# Patient Record
Sex: Male | Born: 2000 | Hispanic: No | Marital: Single | Smoking: Never smoker
Health system: Southern US, Community
[De-identification: ages and names within clinical notes are randomized; demographics above are authoritative.]

## PROBLEM LIST (undated history)

## (undated) DIAGNOSIS — K3580 Unspecified acute appendicitis: Secondary | ICD-10-CM

## (undated) DIAGNOSIS — R569 Unspecified convulsions: Secondary | ICD-10-CM

## (undated) DIAGNOSIS — E230 Hypopituitarism: Secondary | ICD-10-CM

## (undated) HISTORY — DX: Unspecified convulsions: R56.9

## (undated) HISTORY — DX: Unspecified acute appendicitis: K35.80

## (undated) HISTORY — PX: APPENDECTOMY: SHX54

---
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
## 2024-08-09 NOTE — Progress Notes (Signed)
 [facility] NE[facility] CLINIC [address] [place] [address] [place] [address] [phone]  Date: 08/09/2024  Patient Name: Brett Ward  MRN: [id number]  PCP: Alvira Ragena Ip Referring Provider: Pcp, None Per Patient  Assessment & Plan:  Brett Ward is a 23 y.o. 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 He had his first seizure in July 2016.  Following which he was started on Keppra  and remained seizure-free.  In 2019 Keppra  was withdrawn and 3 weeks later he had which after a failed trial of zonisamide he was put back on Keppra  and has been seizure-free since then.  His EEG has shown left frontotemporal spikes and one of his ambulatory EEG in 2020 had shown a right hemispheric seizure.  His MRI brain has been normal We had discussion about withdrawal of antiepileptic and the risk of breakthrough seizures associated with it.  He understood that the risk of seizure is higher than him but would still like to try preferably next summer. Currently he is not experiencing any side effects with Keppra  we will continue the same dose and Keppra  levels. Will consider an EEG just prior to trial of withdrawal Keppra    Epilepsy Classification: Focal, left hemisphere ILAE Seizure Classification: Focal with impaired consciousness Semiology staring followed by right version Etiology: Genetic, SETD2 mutation     Plan Wants to try tapering Keppa again but in next summers-experiencing no side effects Discussed about the epilepsy association with the SETD2 mutation Will continue Keppra  750 mg twice daily Keppra  levels - can get it drawn at his primary care office [Upcoming visit in October] Discussed about seizure precautions     I spent 40 minutes with the patient on the date of service. I spent an  additional 10 minutes on pre- and post-visit activities on the date of service.    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rology Department [facility]  He is accompanied by his mother in this visit who contributes to the history along with Brett Ward.   The first episode occurred in 2016.  The patient was lying in the bed watching TV and then his mother asked to sit up  to receive his growth hormone shot which he had been taking for a while.  She noticed that he was staring with right gaze turning and right body turning with stiffness affecting the whole body.  He turned gray/blue in that event and mother gave some chest compressions and mouth-to-mouth breathing.  His jaws were clenched.  Her sister called 911 it took approximately 20 to 25 minutes for him to come back to himself.  He did not have any recollection of the event and he denied any clonic jerking.  It was not associated with any tongue bite or incontinence.  He was started on Keppra  which he tolerated  well and was effective. In 2019 his Keppra  was tapered off and 3 weeks later on 04/28/2018 he had a breakthrough seizure which was described as staring episode with turning on the right side lasted for less than 2 minutes patient was able to respond more quickly .  He was given a trial of zonisamide which she did not tolerate and was started back on Keppra  which she continues to take.  Discussions have been about withdrawing the medication again  but he deferred until he was in the college.   Seizure types  Type I Focal - left hemisphere Semiology: Staring followed by right head, eye, torso version  Frequency 2016, 2019 after Keppra  withdrawal    Type II Subclinical seizure arising from right hemisphere was noted in the EEG of 12/23/2018 Interval History:  Single event 22 minute EEG seizure, R hemisphere Semiology family and patient not aware of EEG seizure on ambulatory EEG  - 12/23/2018 at 10:25 the EEG shows the development of rhythmical theta range activity in the right hemisphere with subsequent evolution consistent with a focal onset seizure.  This activity continues until at least 10:47 and is followed by an extended period of focal slowing in the right hemisphere.     Seizure Risk Factors/Pertinent history: Preterm, Prenatal Injury, Congenital Malformation: No -3 weeks prior to Eed ,neonatal jaundice  Febrile Seizures:   No Had learning difficulties -no reports of zoning out  Family History of Epilepsy:   Yes -recently her daughter had a seizure at age of 64 yr  like activity -on medication - in [employer] -deployed in [place] after [place]  Stroke:               No Hypoxic brain injury       No Traumatic brain injury:  No Brain Tumor   No Malignancy                             No CNS Infection:               No Prior Neurosurgery                 No Psychiatric disorder                No Alcohol abuse                         No Illicit drug use                          No    CURRENT ASMs: Keppra   PRIOR ASMs: Zonisamide     Driving status:  Drives  Employment:  Applying for some horticulture jobs Social Support : lives with parent   REVIEW OF SYSTEM The review of system was negative except that noted in HPI    PRIOR EVALUATION:   INVESTIGATIONS SUMMARY from Dr Automatic Data note of 03/27/2019[[place]   Laboratory -     07/07/2018 - epilepsy gene panel (181 genes) - One Pathogenic variant identified  in SETD2. 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   04/07/17 - Carbohydrate deficient transferrin    2008-2009  karotype and microarray - normal   2008-2007 Urine oa's: Non-diagnostic urine organic acid pattern with small amounts of ethylmalonic, 2-OH-glutaric, glutaric, and dicarboxylic acids. Urine aa's normal CK 54 46 XY, diGeorge FISH neg.   07/28/15  Normal CBC,  CMP, Vitamin D   Radiology -    06/30/2015 - head CT reported normal, (at [facility]) 2006 - MRI brain @ [facility] (done in [place] reviewed by Dr. Alline): normal 09/12/15  Normal MRI brain    Neurophysiology -  12/01/15 - ambulatory EEG over 72 hours - normal.   09/12/15 Routine EEG: SUMMARY: This is an abnormal EEG because of:  - Spike wave/sharp wave discharges seen over the left frontal-temporal head regions. - At times, slow activity skewed or seen over the left was identified.     12/25/2018 This EEG study is abnormal secondary to the occurrence of an apparent electrographic seizure during the study.    EXTERNAL RECORDS: Reviewed available records   CURRENT MEDICATIONS: Current Medications[1]  ALLERGIES: Allergies[2]  Past Medical Hx: Past Medical History[3]  Past Surgical Hx: Past Surgical History[4]   Social History[5]  Family Hx: Family History[6]      Objective:    Physical Exam:  Blood pressure 110/71, pulse 91, height 168 cm (5' 6.14), weight 62.6 kg (138 lb).  Physical Exam          Neurological Examination:  MS: Alert and with appropriate affect, speech and language are normal, patient is able to provide details of their own medical history; follows 2 step commands; oriented to self, place, time; registration and recall intact at 5 minutes  Cranial nerves: EOMI without nystagmus, visual fields are full, face is symmetric, tongue midline, shoulder shrug symmetric, hearing is intact  Motor: Normal tone and strength bilaterally, no tremor or involuntary  movements  Sensory: Intact to light touch throughout  Reflexes: Symmetric  Coordination: No ataxia on finger to nose testing bilaterally  Gait:  Casual gait is intact, able to tandem without difficulty, Romberg negative    LABS:   Results          Levetiracetam  Lvl  Date Value Ref Range Status  08/11/2017 28.3 12.0 - 46.0 mcg/mL Final    Comment:      -------------------ADDITIONAL INFORMATION------------------- This test was developed and its performance characteristics  determined by [facility] in a manner consistent with CLIA  requirements. This test has not been cleared or approved by  the U.S. Food and Drug Administration.   Test Performed by: [facility] [address] [place], [place], [place] [address]  04/07/2017 8.8 (L) 12.0 - 46.0 mcg/mL Final    Comment:      -------------------ADDITIONAL INFORMATION------------------- This test was developed and its performance characteristics  determined by [facility] in a manner consistent with CLIA  requirements. This test has not been cleared or approved by  the U.S. Food and Drug Administration.   Test Performed by: [facility] [address] [place], [place], [place] [address]     Lab Results  Component Value Date   LEVETIRACETA 28.3 08/11/2017   LEVETIRACETA 8.8 (L) 04/07/2017         Maryfrances Nora, MD Assistant Professor Division of Epilepsy Department of Neurology       [1] Current Outpatient Medications  Medication Sig Dispense Refill  . cholecalciferol, vitamin D3, 25 mcg (1,000 unit) Chew Chew.    . levETIRAcetam  (KEPPRA ) 750 MG tablet Take 1.5 tablets (1,125 mg total) by mouth two (2) times a day. 270 tablet 0  . ondansetron  (ZOFRAN -ODT) 4 MG disintegrating tablet Dissolve 1 tablet (4 mg total) in the mouth every eight (8) hours as needed for nausea. 20 tablet 0  . rizatriptan (MAXALT-MLT)  5 MG disintegrating tablet 1 tablet  PO at start of migraine headache. May repeat in 2 hours if needed. Max 2 per day and 4 per week. Please split between 2 bottles so pt can take medication to school 10 tablet 1   No current facility-administered medications for this visit.  [2] No Known Allergies [3] Past Medical History: Diagnosis Date  . ADHD (attention deficit hyperactivity disorder)   . Financial difficulties   . Growth hormone deficiency (HHS-HCC)   . Hypotonia    received PT and OT  . Mononucleosis   . Pneumonia  1 y  . Seizure       [4] Past Surgical History: Procedure Laterality Date  . APPENDECTOMY    . surgical tooth extractions    [5] Social History Socioeconomic History  . Marital status: Single  Tobacco Use  . Smoking status: Never    Passive exposure: Yes  . Smokeless tobacco: Never  Substance and Sexual Activity  . Sexual activity: Never  Social History Narrative   In the 4th grade doing well. Lives with parents and older sister.   [6] Family History Problem Relation Age of Onset  . Delayed puberty Father   . ADD / ADHD Father   . Migraines Father   . ADD / ADHD Sister   . Glaucoma Maternal Grandmother   . Cataracts Maternal Grandmother   . Glaucoma Paternal Grandfather   . Cataracts Paternal Grandfather   . Amblyopia Neg Hx

## 2024-09-13 ENCOUNTER — Ambulatory Visit: Payer: Self-pay

## 2024-09-13 VITALS — BP 120/80 | HR 78 | Temp 99.2°F | Ht 66.5 in | Wt 137.0 lb

## 2024-09-13 DIAGNOSIS — E559 Vitamin D deficiency, unspecified: Secondary | ICD-10-CM | POA: Diagnosis not present

## 2024-09-13 DIAGNOSIS — Z23 Encounter for immunization: Secondary | ICD-10-CM | POA: Diagnosis not present

## 2024-09-13 DIAGNOSIS — R569 Unspecified convulsions: Secondary | ICD-10-CM

## 2024-09-13 DIAGNOSIS — G43909 Migraine, unspecified, not intractable, without status migrainosus: Secondary | ICD-10-CM | POA: Insufficient documentation

## 2024-09-13 DIAGNOSIS — G43809 Other migraine, not intractable, without status migrainosus: Secondary | ICD-10-CM | POA: Diagnosis not present

## 2024-09-13 DIAGNOSIS — Z113 Encounter for screening for infections with a predominantly sexual mode of transmission: Secondary | ICD-10-CM

## 2024-09-13 NOTE — Progress Notes (Signed)
   Subjective:    Patient ID: Brett Ward, male    DOB: 20-Sep-2001, 23 y.o.   MRN: [id number]   Brett Ward is a very pleasant 23 y.o. male who presents today as a new patient.  Past medical, surgical and family history: Reviewed and updated in chart.  Allergies: Reviewed and updated in chart.  Medications: Reviewed and updated in chart.  Social history: Reviewed and updated in chart.  Last PCP and reason for leaving: Dr Melonie, left  Says he had a migraine earlier today, took migraine medication, but denies needing his rizatriptan more than twice a week. Denies any recent seizures, compliant with his Keppra . Says he was told several years ago that he had vitamin D deficiency and that is why he takes over-the-counter vitamin D.   Review of Systems  All other systems reviewed and are negative.       BP 120/80 (BP Location: Left Arm, Patient Position: Sitting, Cuff Size: Normal)   Pulse 78   Temp 99.2 F (37.3 C) (Oral)   Ht 5' 6.5 (1.689 m)   Wt 137 lb (62.1 kg)   SpO2 98%   BMI 21.78 kg/m   Objective:    Physical Exam Vitals and nursing note reviewed.  Constitutional:      Appearance: Normal appearance.  HENT:     Head: Normocephalic and atraumatic.  Eyes:     Extraocular Movements: Extraocular movements intact.     Conjunctiva/sclera: Conjunctivae normal.  Skin:    General: Skin is warm.  Neurological:     Mental Status: He is alert.  Psychiatric:        Mood and Affect: Mood normal.        Behavior: Behavior normal.         Assessment & Plan:   1. Other migraine without status migrainosus, not intractable (Primary) New patient, past medical and social history thoroughly reviewed and updated in chart.  Symptoms well-controlled on regimen below, no acute changes at this time.  - Continue rizatriptan 5 mg as needed  2. Vitamin D deficiency Reportedly was told he has low vitamin D and has been taking a supplement, no recent labs in chart,  will order below for monitoring.  - VITAMIN D 25 Hydroxy (Vit-D Deficiency, Fractures); Future  3. Seizures (HCC) Patient has a history of seizures with which he follows with neurology from [facility], is compliant with his Keppra , denies any breakthrough seizures, will order level below for monitoring. Per chart review, patient will consider coming off Keppra  in the near future.  - Levetiracetam  level; Future - Continue Keppra  750 mg daily  4. Screening for STD (sexually transmitted disease) 5. Need for influenza vaccination Per chart review, HIV was done but not hep C, ordered below in preparation for upcoming physical, flu shot given this visit.  - Hepatitis C antibody; Future - Flu vaccine trivalent PF, 6mos and older(Flulaval,Afluria,Fluarix,Fluzone)    Return in about 4 weeks (around 10/11/2024) for CPE, Pls schedule for lab visit 1 week prior.   Louan Base K Colie Josten, MD  09/13/24

## 2024-09-13 NOTE — Patient Instructions (Signed)
 Thank you for visiting [facility] today! Here's what we talked about: - Come back in 4wks for physical then lab appointment 1wk prior to that

## 2024-10-05 ENCOUNTER — Ambulatory Visit
Admission: EM | Admit: 2024-10-05 | Discharge: 2024-10-05 | Disposition: A | Discharge status: Home / Self Care | Attending: Emergency Medicine | Admitting: Emergency Medicine

## 2024-10-05 ENCOUNTER — Encounter: Payer: Self-pay | Admitting: Emergency Medicine

## 2024-10-05 ENCOUNTER — Ambulatory Visit: Payer: Self-pay

## 2024-10-05 ENCOUNTER — Telehealth: Payer: Self-pay | Admitting: Emergency Medicine

## 2024-10-05 DIAGNOSIS — B349 Viral infection, unspecified: Secondary | ICD-10-CM | POA: Diagnosis not present

## 2024-10-05 LAB — POC SOFIA SARS ANTIGEN FIA: SARS Coronavirus 2 Ag: POSITIVE — AB

## 2024-10-05 MED ORDER — BENZONATATE 100 MG PO CAPS: 100.0000 mg | ORAL_CAPSULE | Freq: Three times a day (TID) | ORAL | 0 refills | Status: DC

## 2024-10-05 MED ORDER — PROMETHAZINE-DM 6.25-15 MG/5ML PO SYRP: 5.0000 mL | ORAL_SOLUTION | Freq: Four times a day (QID) | ORAL | 0 refills | Status: DC | PRN

## 2024-10-05 MED ORDER — MOLNUPIRAVIR EUA 200MG CAPSULE
4.0000 | ORAL_CAPSULE | Freq: Two times a day (BID) | ORAL | 0 refills | Status: AC
Start: 2024-10-05 — End: 2024-10-10

## 2024-10-05 NOTE — Discharge Instructions (Signed)
 Covid 19 is a virus and should steadily improve in time it can take up to 7 to 10 days before you truly start to see a turnaround however things will get better    Per the CDC you will need to quarantine and to your 24 hours without fever, if no fever may continue activity wearing mask  Begin use of the antiviral taking every morning and every evening for 5 days to reduce the amount of virus within the body, if this is medicine is not covered by your insurance and is highly price you do not have to purchase it as you will get better naturally  You may use Tessalon pill every 8 hours for cough  You may use cough syrup every 6 hours for cough or nausea and vomiting  You can take Tylenol  and/or Ibuprofen as needed for fever reduction and pain relief.   For cough: honey 1/2 to 1 teaspoon (you can dilute the honey in water or another fluid).  You can also use guaifenesin and dextromethorphan for cough. You can use a humidifier for chest congestion and cough.  If you don't have a humidifier, you can sit in the bathroom with the hot shower running.      For sore throat: try warm salt water gargles, cepacol lozenges, throat spray, warm tea or water with lemon/honey, popsicles or ice, or OTC cold relief medicine for throat discomfort.   For congestion: take a daily anti-histamine like Zyrtec, Claritin, and a oral decongestant, such as pseudoephedrine.  You can also use Flonase 1-2 sprays in each nostril daily.   It is important to stay hydrated: drink plenty of fluids (water, gatorade/powerade/pedialyte, juices, or teas) to keep your throat moisturized and help further relieve irritation/discomfort.

## 2024-10-05 NOTE — ED Provider Notes (Signed)
 Brett Ward    CSN: [id number] Arrival date & time: 10/05/24  1427      History   Chief Complaint Chief Complaint  Patient presents with   Cough   Fever   Sore Throat   Emesis   Chills   Generalized Body Aches    HPI Brett Ward is a 23 y.o. male.    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   Attention deficit hyperactivity disorder (ADHD) 03/13/2012   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 Authorizing Provider  benzonatate (TESSALON) 100 MG capsule Take 1 capsule (100 mg total) by mouth every 8 (eight) hours. 10/05/24  Yes Sadik Piascik R, NP  molnupiravir EUA (LAGEVRIO) 200 mg CAPS capsule Take 4 capsules (800 mg total) by mouth 2 (two) times daily for 5 days. 10/05/24 10/10/24 Yes Suleima Ohlendorf, Shelba SAUNDERS, NP  promethazine -dextromethorphan (PROMETHAZINE -DM) 6.25-15 MG/5ML  syrup Take 5 mLs by mouth 4 (four) times daily as needed. 10/05/24  Yes Jazara Swiney R, NP  Cholecalciferol (VITAMIN D-3 PO) Take by mouth.    [provider]  Cyanocobalamin (B-12 PO) Take by mouth.    [provider]  levETIRAcetam  (KEPPRA ) 750 MG tablet Take by mouth. 12/23/17   [provider]  ondansetron  (ZOFRAN -ODT) 4 MG disintegrating tablet Take 4 mg by mouth every 8 (eight) hours as needed. 07/17/24   [provider]  rizatriptan (MAXALT-MLT) 5 MG disintegrating tablet Take 5 mg by mouth.  1 tablet PO at start of migraine headache. May repeat in 2 hours if needed. Max 2 per day and 4 per week. Please split between 2 bottles so pt can take medication to school 07/17/24   [provider]    Family History Family History  Problem Relation Age of Onset   Healthy Mother    Healthy Father    Hypertension Maternal Grandmother    Hypertension Maternal Grandfather     Social History Social History   Tobacco Use   Smoking status: Never    Passive exposure: Current   Smokeless tobacco: Never  Vaping Use   Vaping status: Never Used  Substance Use Topics   Alcohol use: Never   Drug use: Never     Allergies   Patient has no known allergies.   Review of Systems Review of Systems  Constitutional:  Positive for fever.  Respiratory:  Positive for cough.   Gastrointestinal:  Positive  for vomiting.     Physical Exam Triage Vital Signs ED Triage Vitals  Encounter Vitals Group     BP 10/05/24 1619 123/84     Girls Systolic BP Percentile --      Girls Diastolic BP Percentile --      Boys Systolic BP Percentile --      Boys Diastolic BP Percentile --      Pulse Rate 10/05/24 1619 (!) 101     Resp 10/05/24 1619 20     Temp 10/05/24 1619 98.2 F (36.8 C)     Temp Source 10/05/24 1619 Oral     SpO2 10/05/24 1619 96 %     Weight --      Height --      Head Circumference --      Peak Flow --      Pain Score 10/05/24 1616 3      Pain Loc --      Pain Education --      Exclude from Growth Chart --    No data found.  Updated Vital Signs BP 123/84 (BP Location: Right Arm)   Pulse (!) 101   Temp 98.2 F (36.8 C) (Oral)   Resp 20   SpO2 96%   Visual Acuity Right Eye Distance:   Left Eye Distance:   Bilateral Distance:    Right Eye Near:   Left Eye Near:    Bilateral Near:     Physical Exam Constitutional:      Appearance: Normal appearance.  HENT:     Head: Normocephalic.     Right Ear: Tympanic membrane, ear canal and external ear normal.     Left Ear: Tympanic membrane, ear canal and external ear normal.     Nose: Congestion present.     Mouth/Throat:     Pharynx: No oropharyngeal exudate or posterior oropharyngeal erythema.  Eyes:     Extraocular Movements: Extraocular movements intact.  Cardiovascular:     Rate and Rhythm: Normal rate and regular rhythm.     Pulses: Normal pulses.     Heart sounds: Normal heart sounds.  Pulmonary:     Effort: Pulmonary effort is normal.     Breath sounds: Normal breath sounds.  Neurological:     Mental Status: He is alert and oriented to person, place, and time. Mental status is at baseline.      UC Treatments / Results  Labs (all labs ordered are listed, but only abnormal results are displayed) Labs Reviewed  POC SOFIA SARS ANTIGEN FIA - Abnormal; Notable for the following components:      Result Value   SARS Coronavirus 2 Ag Positive (*)    All other components within normal limits    EKG   Radiology No results found.  Procedures Procedures (including critical care time)  Medications Ordered in UC Medications - No data to display  Initial Impression / Assessment and Plan / UC Course  I have reviewed the triage vital signs and the nursing notes.  Pertinent labs & imaging results that were available during my care of the patient were reviewed by me and considered in my medical decision making (see chart for details).  COVID-19, viral  illness  Patient is in no signs of distress nor toxic appearing.  Vital signs are stable.  Low suspicion for pneumonia, pneumothorax or bronchitis and therefore will defer imaging.  Prescribed antiviral, Tessalon and Promethazine  DM.  Discussed quarantine per the CDC.May use additional over-the-counter medications as needed  for supportive care.  May follow-up with urgent care as needed if symptoms persist or worsen.  Note given.   Final Clinical Impressions(s) / UC Diagnoses   Final diagnoses:  Viral illness     Discharge Instructions      Covid 19 is a virus and should steadily improve in time it can take up to 7 to 10 days before you truly start to see a turnaround however things will get better    Per the CDC you will need to quarantine and to your 24 hours without fever, if no fever may continue activity wearing mask  Begin use of the antiviral taking every morning and every evening for 5 days to reduce the amount of virus within the body, if this is medicine is not covered by your insurance and is highly price you do not have to purchase it as you will get better naturally  You may use Tessalon pill every 8 hours for cough  You may use cough syrup every 6 hours for cough or nausea and vomiting  You can take Tylenol  and/or Ibuprofen as needed for fever reduction and pain relief.   For cough: honey 1/2 to 1 teaspoon (you can dilute the honey in water or another fluid).  You can also use guaifenesin and dextromethorphan for cough. You can use a humidifier for chest congestion and cough.  If you don't have a humidifier, you can sit in the bathroom with the hot shower running.      For sore throat: try warm salt water gargles, cepacol lozenges, throat spray, warm tea or water with lemon/honey, popsicles or ice, or OTC cold relief medicine for throat discomfort.   For congestion: take a daily anti-histamine like Zyrtec, Claritin, and a oral decongestant, such as pseudoephedrine.  You  can also use Flonase 1-2 sprays in each nostril daily.   It is important to stay hydrated: drink plenty of fluids (water, gatorade/powerade/pedialyte, juices, or teas) to keep your throat moisturized and help further relieve irritation/discomfort.    ED Prescriptions     Medication Sig Dispense Auth. Provider   promethazine -dextromethorphan (PROMETHAZINE -DM) 6.25-15 MG/5ML syrup Take 5 mLs by mouth 4 (four) times daily as needed. 118 mL Rayme Bui R, NP   benzonatate (TESSALON) 100 MG capsule Take 1 capsule (100 mg total) by mouth every 8 (eight) hours. 21 capsule Georgette Helmer R, NP   molnupiravir EUA (LAGEVRIO) 200 mg CAPS capsule Take 4 capsules (800 mg total) by mouth 2 (two) times daily for 5 days. 40 capsule Yuriana Gaal R, NP      PDMP not reviewed this encounter.   Teresa Shelba SAUNDERS, NP 10/05/24 1723

## 2024-10-05 NOTE — Telephone Encounter (Signed)
 Changed antiviral to Paxlovid

## 2024-10-05 NOTE — Telephone Encounter (Signed)
 FYI Only or Action Required?: FYI only for provider.  Patient was last seen in primary care on 09/13/2024 by Brett Reuben POUR, MD.  Called Nurse Triage reporting Cough.  Symptoms began several days ago.  Interventions attempted: OTC medications: Mucinex.  Symptoms are: gradually worsening.  Triage Disposition: See Physician Within 24 Hours  Patient/caregiver understands and will follow disposition?: Yes       Copied from CRM #[id number]. Topic: Clinical - Red Word Triage >> Oct 05, 2024 12:03 PM Brett Ward wrote: Red Word that prompted transfer to Nurse Triage: no taste, no smell, fever (unsure of temp) but felt as if he was on fire, chills, body aches (here and there) cough. Would like covid test Reason for Disposition  [1] Continuous (nonstop) coughing interferes with work or school AND [2] no improvement using cough treatment per Care Advice  Answer Assessment - Initial Assessment Questions Patient requesting Covid testing and given information for [facility] to be seen today for symptoms.   1. ONSET: When did the cough begin?      Wednesday morning  2. SEVERITY: How bad is the cough today?      Moderate  3. SPUTUM: Describe the color of your sputum (e.g., none, dry cough; clear, white, yellow, green)     Clear  4. HEMOPTYSIS: Are you coughing up any blood? If Yes, ask: How much? (e.g., flecks, streaks, tablespoons, etc.)     Small amounts of blood  5. DIFFICULTY BREATHING: Are you having difficulty breathing? If Yes, ask: How bad is it? (e.g., mild, moderate, severe)      Denies  6. FEVER: Do you have a fever? If Yes, ask: What is your temperature, how was it measured, and when did it start?     Unsure  7. CARDIAC HISTORY: Do you have any history of heart disease? (e.g., heart attack, congestive heart failure)      Denies  8. LUNG HISTORY: Do you have any history of lung disease?  (e.g., pulmonary embolus, asthma, emphysema)      Denies  9. OTHER SYMPTOMS: Do you have any other symptoms? (e.g., runny nose, wheezing, chest pain)       Loss of taste/ smell, runny nose, chills, vomiting, and body aches  Protocols used: Cough - Acute Productive-A-AH

## 2024-10-05 NOTE — ED Triage Notes (Signed)
 Patient cough, fever, nasal congestion, sore throat, vomiting, chills and body aches x 3 days. Patient took mucinex with mild relief. Rates sore throat 3/10, and body aches 3/10.  Patient drinking Electrolit Zero and is able to keep it down.

## 2024-10-30 ENCOUNTER — Other Ambulatory Visit

## 2024-10-30 ENCOUNTER — Ambulatory Visit: Payer: Self-pay

## 2024-10-30 DIAGNOSIS — R569 Unspecified convulsions: Secondary | ICD-10-CM

## 2024-10-30 DIAGNOSIS — E559 Vitamin D deficiency, unspecified: Secondary | ICD-10-CM | POA: Diagnosis not present

## 2024-10-30 DIAGNOSIS — Z113 Encounter for screening for infections with a predominantly sexual mode of transmission: Secondary | ICD-10-CM

## 2024-10-30 LAB — VITAMIN D 25 HYDROXY (VIT D DEFICIENCY, FRACTURES): VITD: 19.58 ng/mL — ABNORMAL LOW (ref 30.00–100.00)

## 2024-10-31 LAB — HEPATITIS C ANTIBODY: Hepatitis C Ab: NONREACTIVE

## 2024-11-01 LAB — LEVETIRACETAM LEVEL: Levetiracetam Lvl: 41.4 ug/mL — ABNORMAL HIGH (ref 10.0–40.0)

## 2024-11-06 ENCOUNTER — Ambulatory Visit (INDEPENDENT_AMBULATORY_CARE_PROVIDER_SITE_OTHER)

## 2024-11-06 VITALS — BP 98/62 | HR 83 | Temp 99.0°F | Ht 66.5 in | Wt 147.0 lb

## 2024-11-06 DIAGNOSIS — Z0000 Encounter for general adult medical examination without abnormal findings: Secondary | ICD-10-CM

## 2024-11-06 DIAGNOSIS — E559 Vitamin D deficiency, unspecified: Secondary | ICD-10-CM | POA: Diagnosis not present

## 2024-11-06 DIAGNOSIS — R569 Unspecified convulsions: Secondary | ICD-10-CM

## 2024-11-06 DIAGNOSIS — Z23 Encounter for immunization: Secondary | ICD-10-CM

## 2024-11-06 NOTE — Addendum Note (Signed)
 Addended by: KALLIE CLOTILDA SQUIBB on: 11/06/2024 12:27 PM   Modules accepted: Orders

## 2024-11-06 NOTE — Patient Instructions (Addendum)
 Thank you for visiting [facility] today! Here's what we talked about: - Send me dose of seizure tablet - Covid vaccine from pharmacy - Set up vision appointment

## 2024-11-06 NOTE — Progress Notes (Signed)
 Assessment & Plan:   This visit was conducted in person. The patient gave informed consent to the use of [employer] AI technology to record the contents of the encounter as documented below.  Assessment & Plan   I have personally reviewed and have noted: 1. The patient's medical and social history 2. Their use of alcohol, tobacco or illicit drugs 3. Their current medications and supplements 4. The patient's functional ability including ADL's, fall risks, home safety risks and hearing or visual impairment. 5. Diet and physical activities 6. Evidence for depression or mood disorder  Labs: HIV, Hep C UTD, not sexually active, declines other STD testing Immunizations: Given Td today and COVID from pharmacy Diet and Exercise: Walking 2 times a week, needs to eat more veggies Sleep and mood: No concerns Dental and vision: Needs vision checked, uses glasses Health counselling given: Counseled about getting enough exercise, weekly for a healthy adult  Seizures No breakthrough seizures, well controlled on Keppra  as below, most recent level WNL. Also follows with neurology.  -Continue Keppra  750mg  BID (Taking 1.5 tablets of the 500mg  pill)  Vitamin D  deficiency Most recent lab low at 19, continues on supplement  -Continue daily Vit D supplement   Follow-up: Return in about 1 year (around 11/06/2025) for CPE.       Subjective:   Patient ID: Brett Ward, male    DOB: [birth year]  Age: 23 y.o. MRN: [id number]  Patient Care Team: Bennett Reuben POUR, MD as PCP - General (Family Medicine)   CC:  Chief Complaint  Patient presents with   Annual Exam    Here with mom.  Discuss recent labs.     Brett Ward is a 23 y.o. male here for annual physical and f/u on chronic conditions, see assessment and plan for further details.  Discussed the use of AI scribe software for clinical note transcription with the patient, who gave verbal consent to proceed.  History of Present  Illness No acute complaints today, taking all meds as prescribed. Denies any recent seizures.   Depression Screening;    11/06/2024   11:03 AM 09/13/2024    4:10 PM 10/04/2019    3:30 PM 09/25/2019    4:56 PM 09/19/2018    4:27 PM 07/01/2018   10:07 AM 04/14/2018   10:30 AM  PHQ 2/9 Scores  PHQ - 2 Score 0 0 0 0 0 0 0     Anxiety Screening:     No data to display           ROS: Negative unless specifically indicated above in HPI.      Objective:     BP 98/62 (BP Location: Left Arm, Patient Position: Sitting, Cuff Size: Normal)   Pulse 83   Temp 99 F (37.2 C) (Oral)   Ht 5' 6.5 (1.689 m)   Wt 147 lb (66.7 kg)   SpO2 98%   BMI 23.37 kg/m    Physical Exam   Physical Exam Vitals and nursing note reviewed.  Constitutional:      Appearance: Normal appearance. He is normal weight.  HENT:     Head: Normocephalic and atraumatic.     Right Ear: Tympanic membrane and ear canal normal.     Left Ear: Tympanic membrane and ear canal normal.     Nose: Nose normal.     Mouth/Throat:     Mouth: Mucous membranes are moist.  Eyes:     Extraocular Movements: Extraocular movements intact.  Pupils: Pupils are equal, round, and reactive to light.  Cardiovascular:     Rate and Rhythm: Normal rate and regular rhythm.     Heart sounds: No murmur heard. Pulmonary:     Effort: Pulmonary effort is normal.     Breath sounds: Normal breath sounds.  Abdominal:     General: Abdomen is flat. Bowel sounds are normal.     Tenderness: There is no abdominal tenderness.  Musculoskeletal:     Cervical back: Neck supple.  Lymphadenopathy:     Cervical: No cervical adenopathy.  Skin:    General: Skin is warm.  Neurological:     Mental Status: He is alert.     Coordination: Coordination normal.     Gait: Gait normal.  Psychiatric:        Mood and Affect: Mood normal.        Behavior: Behavior normal.          Reuben MARLA Burkes, MD  11/06/24    Contains text generated by  Abridge AI Software.

## 2024-12-17 ENCOUNTER — Emergency Department

## 2024-12-17 ENCOUNTER — Other Ambulatory Visit: Payer: Self-pay

## 2024-12-17 ENCOUNTER — Emergency Department
Admission: EM | Admit: 2024-12-17 | Discharge: 2024-12-17 | Disposition: A | Discharge status: Home / Self Care | Attending: Emergency Medicine | Admitting: Emergency Medicine

## 2024-12-17 DIAGNOSIS — R519 Headache, unspecified: Secondary | ICD-10-CM | POA: Insufficient documentation

## 2024-12-17 DIAGNOSIS — H532 Diplopia: Secondary | ICD-10-CM | POA: Insufficient documentation

## 2024-12-17 LAB — CBC WITH DIFFERENTIAL/PLATELET
Abs Immature Granulocytes: 0.03 K/uL (ref 0.00–0.07)
Basophils Absolute: 0.1 K/uL (ref 0.0–0.1)
Basophils Relative: 1 %
Eosinophils Absolute: 0.1 K/uL (ref 0.0–0.5)
Eosinophils Relative: 1 %
HCT: 43 % (ref 39.0–52.0)
Hemoglobin: 14.4 g/dL (ref 13.0–17.0)
Immature Granulocytes: 0 %
Lymphocytes Relative: 18 %
Lymphs Abs: 1.8 K/uL (ref 0.7–4.0)
MCH: 27.9 pg (ref 26.0–34.0)
MCHC: 33.5 g/dL (ref 30.0–36.0)
MCV: 83.3 fL (ref 80.0–100.0)
Monocytes Absolute: 0.6 K/uL (ref 0.1–1.0)
Monocytes Relative: 6 %
Neutro Abs: 7.6 K/uL (ref 1.7–7.7)
Neutrophils Relative %: 74 %
Platelets: 289 K/uL (ref 150–400)
RBC: 5.16 MIL/uL (ref 4.22–5.81)
RDW: 12.3 % (ref 11.5–15.5)
WBC: 10.2 K/uL (ref 4.0–10.5)
nRBC: 0 % (ref 0.0–0.2)

## 2024-12-17 LAB — BASIC METABOLIC PANEL WITH GFR
Anion gap: 12 (ref 5–15)
BUN: 13 mg/dL (ref 6–20)
CO2: 26 mmol/L (ref 22–32)
Calcium: 9.5 mg/dL (ref 8.9–10.3)
Chloride: 103 mmol/L (ref 98–111)
Creatinine, Ser: 0.6 mg/dL — ABNORMAL LOW (ref 0.61–1.24)
GFR, Estimated: >60 mL/min
Glucose, Bld: 89 mg/dL (ref 70–99)
Potassium: 3.5 mmol/L (ref 3.5–5.1)
Sodium: 141 mmol/L (ref 135–145)

## 2024-12-17 MED ORDER — GADOBUTROL 1 MMOL/ML IV SOLN
6.0000 mL | Freq: Once | INTRAVENOUS | Status: AC | PRN
  Administered 2024-12-17: 6 mL via INTRAVENOUS

## 2024-12-17 MED ORDER — IOHEXOL 350 MG/ML SOLN
75.0000 mL | Freq: Once | INTRAVENOUS | Status: AC | PRN
  Administered 2024-12-17: 75 mL via INTRAVENOUS

## 2024-12-17 MED ORDER — ONDANSETRON HCL 4 MG/2ML IJ SOLN
4.0000 mg | Freq: Once | INTRAMUSCULAR | Status: AC
  Administered 2024-12-17: 4 mg via INTRAVENOUS
  Filled 2024-12-17: qty 2

## 2024-12-17 MED ORDER — LORAZEPAM 2 MG/ML IJ SOLN: 1.0000 mg | INTRAMUSCULAR | Status: DC | PRN

## 2024-12-17 NOTE — ED Notes (Signed)
 See triage note  Presents with hx of headache and eye pain which started 3 days ago States he is having double vision Denies any fever or n/v/d or trauma Mom states he has a hx of sz  But has been sz free for several years  Was scheduled to have EEG today

## 2024-12-17 NOTE — ED Provider Notes (Signed)
"  [facility] Provider Note    Event Date/Time   First MD Initiated Contact with Patient 12/17/24 1351     (approximate)   History   Eye Pain   HPI  Brett Ward is a 24 y.o. male with a past medical history of seizure disorder on Keppra , receptive-expressive language disorder, pituitary dwarfism who presents today for evaluation of 3 days of eye pain and headache.  Mom reports that his eyes seem to be looking in different directions.  Patient reports that he has had double vision.  He reports that his headache is across the front of his forehead behind both of his eyes and radiates to the top of his head.  He feels nauseated but has not had any vomiting.  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          Physical Exam   Triage Vital Signs: ED Triage Vitals  Encounter Vitals Group     BP 12/17/24 1329 130/75     Girls Systolic BP Percentile --      Girls Diastolic BP Percentile --      Boys Systolic BP Percentile --      Boys Diastolic BP Percentile --      Pulse Rate 12/17/24 1329 (!) 115     Resp 12/17/24 1329 18     Temp 12/17/24 1329 98 F (36.7 C)     Temp src --      SpO2 12/17/24 1329 100 %     Weight 12/17/24 1329 140 lb (63.5 kg)     Height 12/17/24 1329 5' 7 (1.702 m)     Head Circumference --      Peak Flow --      Pain Score 12/17/24 1328 0     Pain Loc --      Pain Education  --      Exclude from Growth Chart --     Most recent vital signs: Vitals:   12/17/24 1329 12/17/24 1806  BP: 130/75 128/70  Pulse: (!) 115 (!) 108  Resp: 18 18  Temp: 98 F (36.7 C) 98.1 F (36.7 C)  SpO2: 100% 100%    Physical Exam Vitals and nursing note reviewed.  Constitutional:      General: Awake and alert. No acute distress.    Appearance: Normal appearance. The patient is normal weight.  HENT:     Head: Normocephalic and atraumatic.     Mouth: Mucous membranes are moist.  Eyes:     General: PERRL.     Right eye: No discharge.        Left eye: No discharge.     Conjunctiva/sclera: Conjunctivae normal.  Cardiovascular:     Rate and Rhythm: Normal rate and regular rhythm.     Pulses: Normal pulses.  Pulmonary:     Effort: Pulmonary effort is normal. No respiratory distress.     Breath sounds: Normal breath sounds.  Abdominal:     Abdomen is soft. There is no abdominal tenderness.  No rebound or guarding. No distention. Musculoskeletal:        General: No swelling. Normal range of motion.     Cervical back: Normal range of motion and neck supple.  Skin:    General: Skin is warm and dry.     Capillary Refill: Capillary refill takes less than 2 seconds.     Findings: No rash.  Neurological:     Mental Status: The patient is awake and alert.  Right eye has a disconjugate gaze, rotated inwards towards nose.  This corrects when covering the left eye.  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, unable to look laterally GCS 15 alert and oriented x3 Normal speech, no expressive or receptive aphasia or dysarthria Rest of cranial nerves intact 5 out of 5 strength in all 4 extremities with intact sensation throughout No extremity drift Normal finger-to-nose testing, no limb or truncal ataxia   ED Results / Procedures / Treatments   Labs (all labs ordered are listed, but only abnormal results are displayed) Labs Reviewed  BASIC METABOLIC PANEL WITH GFR - Abnormal;  Notable for the following components:      Result Value   Creatinine, Ser 0.60 (*)    All other components within normal limits  CBC WITH DIFFERENTIAL/PLATELET     EKG     RADIOLOGY     PROCEDURES:  Critical Care performed:   Procedures   MEDICATIONS ORDERED IN ED: Medications  iohexol  (OMNIPAQUE ) 350 MG/ML injection 75 mL (has no administration in time range)  ondansetron  (ZOFRAN ) injection 4 mg (4 mg Intravenous Given 12/17/24 1447)     IMPRESSION / MDM / ASSESSMENT AND PLAN / ED COURSE  I reviewed the triage vital signs and the nursing notes.   Differential diagnosis includes, but is not limited to, intracranial mass, vascular abnormality, cavernous sinus thrombosis.  Patient is awake and alert, tachycardic on arrival to normotensive and afebrile.  His right eye was deviated inward on exam, and he appeared to have a left cranial nerve VI palsy.  He got quite dizzy with extraocular movements and vomited in the hallway.  Rest of his neuroexam is normal.  Discussed with Dr. Arlander who also evaluated the patient.  Plan for non-con head CT, CTA head and neck, and if normal then we will proceed with MRI.  Patient was treated symptomatically with IV Zofran  with good effect.  Labs obtained prior to CTA are normal.  Patient was reevaluated multiple times throughout his emergency department stay.  Disconjugate gaze is still present though improved.  Patient passed off to Dr. Willo pending CT angiogram, potential MRI/MRV, and final disposition.   Patient's presentation is most consistent with acute presentation with potential threat to life or bodily function.   Clinical Course as of 12/17/24 1812  Mon Dec 17, 2024  1607 Patient reevaluated.  Disconjugate gaze has improved, patient has forward looking eyes bilaterally.  Improved but still present left cranial nerve VI palsy [JP]    Clinical Course User Index [JP] Lavonya Hoerner E, PA-C     FINAL CLINICAL IMPRESSION(S)  / ED DIAGNOSES   Final diagnoses:  Diplopia  Acute nonintractable headache, unspecified headache type     Rx / DC Orders   ED Discharge Orders     None        Note:  This document was prepared using Dragon voice recognition software and may include unintentional dictation errors.   Jenevieve Kirschbaum E, PA-C 12/17/24 1813    Willo Dunnings, MD  12/17/24 2335 ° °"

## 2024-12-17 NOTE — ED Provider Notes (Signed)
----------------------------------------- °  6:08 PM on 12/17/2024 -----------------------------------------  Blood pressure 128/70, pulse (!) 108, temperature 98.1 F (36.7 C), resp. rate 18, height 5' 7 (1.702 m), weight 63.5 kg, SpO2 100%.  Assuming care from PA Poggi.  In short, Brett Ward is a 24 y.o. male with a chief complaint of visual disturbance.  Refer to the original H&P for additional details.  The current plan of care is to follow-up CTA head and neck, check MRI if unremarkable.  ----------------------------------------- 9:43 PM on 12/17/2024 ----------------------------------------- CTA head and neck is negative for acute finding, no evidence of aneurysm.  On reassessment, patient does not appear to have any diplopia or cranial nerve palsy. MR brain as well as MRV performed and are both negative for acute finding as well.  Patient appropriate for discharge with outpatient neurology follow-up, will also provide with referral to ophthalmology.  Patient and mother counseled to return to the ED for new or worsening symptoms, patient agrees with plan.  Medications  ondansetron  (ZOFRAN ) injection 4 mg (4 mg Intravenous Given 12/17/24 1447)  iohexol  (OMNIPAQUE ) 350 MG/ML injection 75 mL (75 mLs Intravenous Contrast Given 12/17/24 1816)  gadobutrol  (GADAVIST ) 1 MMOL/ML injection 6 mL (6 mLs Intravenous Contrast Given 12/17/24 2018)     ED Discharge Orders     None      Final diagnoses:  Diplopia  Acute nonintractable headache, unspecified headache type      Willo Dunnings, MD 12/17/24 2144

## 2024-12-17 NOTE — ED Triage Notes (Signed)
 Pt comes with right eye and headache for three days.

## 2025-11-06 ENCOUNTER — Other Ambulatory Visit

## 2025-11-14 ENCOUNTER — Encounter
